# Patient Record
Sex: Male | Born: 2005 | Race: Black or African American | Hispanic: No | Marital: Single
Health system: Southern US, Community
[De-identification: ages and names within clinical notes are randomized; demographics above are authoritative.]

---
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
## 2024-02-02 ENCOUNTER — Emergency Department (HOSPITAL_COMMUNITY)
Admission: EM | Admit: 2024-02-02 | Discharge: 2024-02-02 | Discharge status: Against Medical Advice | Attending: Emergency Medicine | Admitting: Emergency Medicine

## 2024-02-02 ENCOUNTER — Other Ambulatory Visit: Payer: Self-pay

## 2024-02-02 ENCOUNTER — Encounter (HOSPITAL_COMMUNITY): Payer: Self-pay

## 2024-02-02 DIAGNOSIS — Z5321 Procedure and treatment not carried out due to patient leaving prior to being seen by health care provider: Secondary | ICD-10-CM | POA: Diagnosis not present

## 2024-02-02 DIAGNOSIS — L02214 Cutaneous abscess of groin: Secondary | ICD-10-CM | POA: Insufficient documentation

## 2024-02-02 NOTE — ED Triage Notes (Signed)
 Pt arrives via POV. Pt reports he has a possible abscess to his sacral area. States he was at [facility] today and they told him he may have cellulitis. Denies fevers or any other symptoms. Pt is AxOx4.

## 2024-02-03 ENCOUNTER — Emergency Department (HOSPITAL_BASED_OUTPATIENT_CLINIC_OR_DEPARTMENT_OTHER)
Admission: EM | Admit: 2024-02-03 | Discharge: 2024-02-03 | Disposition: A | Discharge status: Home / Self Care | Attending: Emergency Medicine | Admitting: Emergency Medicine

## 2024-02-03 ENCOUNTER — Encounter (HOSPITAL_BASED_OUTPATIENT_CLINIC_OR_DEPARTMENT_OTHER): Payer: Self-pay | Admitting: Emergency Medicine

## 2024-02-03 DIAGNOSIS — L0231 Cutaneous abscess of buttock: Secondary | ICD-10-CM | POA: Insufficient documentation

## 2024-02-03 MED ORDER — DOXYCYCLINE HYCLATE 100 MG PO CAPS: 100.0000 mg | ORAL_CAPSULE | Freq: Two times a day (BID) | ORAL | 0 refills | Status: AC

## 2024-02-03 MED ORDER — DOXYCYCLINE HYCLATE 100 MG PO CAPS: 100.0000 mg | ORAL_CAPSULE | Freq: Two times a day (BID) | ORAL | 0 refills | Status: DC

## 2024-02-03 MED ORDER — LIDOCAINE-EPINEPHRINE-TETRACAINE (LET) TOPICAL GEL
3.0000 mL | Freq: Once | TOPICAL | Status: AC
  Administered 2024-02-03: 3 mL via TOPICAL
  Filled 2024-02-03: qty 3

## 2024-02-03 NOTE — ED Triage Notes (Signed)
 C/o possible abscess on lower back and sacral area. Seen at [facility] and [facility] yesterday. Was told by [facility] that he had cellulitis. Denies fever.

## 2024-02-03 NOTE — ED Provider Notes (Signed)
 [name] Provider Note   CSN: [id number] Arrival date & time: 02/03/24  1257     Patient presents with: Abscess   Marcus Spencer is a 18 y.o. male.  Without significant past medical history reports to emergency room with complaint of gluteal abscess.  Patient reports for the past 3 to 4 days he has had some pain right above his tailbone.  Had some surrounding erythema as well.  Denies fever or spontaneous drainage of the area.  He reports he can feel small area that he thinks is an abscess.  Has been on clindamycin for 1 day as prescribed by urgent care.    Abscess      Prior to Admission medications   Medication Sig Start Date End Date Taking? Authorizing Provider  amoxicillin  (AMOXIL ) 500 MG capsule Take 1 capsule (500 mg total) by mouth 3 (three) times daily. 12/08/22   Molpus, John, MD  HYDROcodone -acetaminophen  (NORCO) 5-325 MG tablet Take 1 tablet by mouth every 6 (six) hours as needed for severe pain. 12/08/22   Molpus, John, MD  naproxen  (NAPROSYN ) 500 MG tablet Take 1 tablet twice daily as needed for dental pain. 12/08/22   Molpus, Norleen, MD    Allergies: Patient has no known allergies.    Review of Systems  Skin:  Positive for wound.    Updated Vital Signs BP 126/74   Pulse 67   Temp 98.5 F (36.9 C) (Oral)   Resp 18   SpO2 99%   Physical Exam Vitals and nursing note reviewed.  Constitutional:      General: He is not in acute distress.    Appearance: He is not toxic-appearing.  HENT:     Head: Normocephalic and atraumatic.   Eyes:     General: No scleral icterus.    Conjunctiva/sclera: Conjunctivae normal.    Cardiovascular:     Rate and Rhythm: Normal rate and regular rhythm.     Pulses: Normal pulses.     Heart sounds: Normal heart sounds.  Pulmonary:     Effort: Pulmonary effort is normal. No respiratory distress.     Breath sounds: Normal breath sounds.  Abdominal:     General: Abdomen is flat.  Bowel sounds are normal.     Palpations: Abdomen is soft.     Tenderness: There is no abdominal tenderness.  Genitourinary:    Comments: Very small palpable abscess without any obvious area of fluctuance in left gluteal fold.  No significant surrounding cellulitis at this time, reports improved since yesterday.   Musculoskeletal:     Right lower leg: No edema.     Left lower leg: No edema.   Skin:    General: Skin is warm and dry.     Findings: No lesion.   Neurological:     General: No focal deficit present.     Mental Status: He is alert and oriented to person, place, and time. Mental status is at baseline.     (all labs ordered are listed, but only abnormal results are displayed) Labs Reviewed - No data to display  EKG: None  Radiology: No results found.   Procedures   Medications Ordered in the ED  lidocaine -EPINEPHrine -tetracaine  (LET) topical gel (has no administration in time range)                                    Medical Decision Making Risk  Prescription drug management.   This patient presents to the ED for concern of abscess, this involves an extensive number of treatment options, and is a complaint that carries with it a high risk of complications and morbidity.  The differential diagnosis includes perirectal abscess, pilonidal cyst, cellulitis, hemorrhoid, fistula    Problem List / ED Course / Critical interventions / Medication management  Patient has small abscess to left gluteal fold.  He has no surrounding cellulitis.  Exam is consistent with small abscess.  There is no palpable area of fluctuance thus looked at area with point-of-care ultrasound showing approximately 0.5 cm area of abscess.  We discussed continuing antibiotic treatment versus trial of aspiration versus incision and drainage.  Patient ultimately does not want to have incision and drainage and wants to continue trial of antibiotic.  Will send doxycycline .  He will return if symptoms  have not improved or worsen in 2 to 3 days.  He has no fever.  Hemodynamically stable.  Well-appearing I ordered medication including LET Reevaluation of the patient after these medicines showed that the patient improved I have reviewed the patients home medicines and have made adjustments as needed   Plan F/u w/ PCP in 2-3d to ensure resolution of sx.  Patient was given return precautions. Patient stable for discharge at this time.  Patient educated on sx/dx and verbalized understanding of plan. Return to ER w/ new or worsening sx.       Final diagnoses:  Gluteal abscess    ED Discharge Orders          Ordered    doxycycline  (VIBRAMYCIN ) 100 MG capsule  2 times daily        02/03/24 1626               Marcus Spencer, Warren SAILOR, PA-C 02/03/24 1627    Mannie Pac T, DO 02/05/24 [phone]

## 2024-02-03 NOTE — Discharge Instructions (Addendum)
 Return in 2 to 3 days for wound check if no improvement in symptoms.  1000 mg of Tylenol  every 8 hours.  You can also take ibuprofen every 8 hours.  He can use ice or warm compress over area.

## 2024-02-03 NOTE — ED Notes (Signed)
 DC paperwork given and verbally understood.

## 2024-02-08 ENCOUNTER — Emergency Department (HOSPITAL_BASED_OUTPATIENT_CLINIC_OR_DEPARTMENT_OTHER): Admission: EM | Admit: 2024-02-08 | Discharge: 2024-02-08 | Disposition: A | Discharge status: Home / Self Care

## 2024-02-08 ENCOUNTER — Other Ambulatory Visit: Payer: Self-pay

## 2024-02-08 DIAGNOSIS — Z4800 Encounter for change or removal of nonsurgical wound dressing: Secondary | ICD-10-CM | POA: Insufficient documentation

## 2024-02-08 DIAGNOSIS — L0231 Cutaneous abscess of buttock: Secondary | ICD-10-CM | POA: Insufficient documentation

## 2024-02-08 DIAGNOSIS — Z5189 Encounter for other specified aftercare: Secondary | ICD-10-CM

## 2024-02-08 NOTE — Discharge Instructions (Addendum)
 It was a pleasure taking care of you today.  Based on your history and physical exam I feel you are safe for discharge.  Overall your abscess looks much improved, no sign of redness, no tenderness, no areas of fluctuance.  You also do not have systemic symptoms, no fever and no chills.  Please continue the course of antibiotics that were prescribed to you previously and complete the entire course.  Please return to the emergency department or seek further medical care if you experiencing the following symptoms including but not limited to fever, chills, severe pain, drainage from infection site, swelling.  Commend follow-up with primary care and specialist as scheduled.

## 2024-02-08 NOTE — ED Provider Notes (Signed)
 [place] EMERGENCY DEPARTMENT AT [facility] Provider Note   CSN: [id number] Arrival date & time: 02/08/24  2035     Patient presents with: Wound Check   Marcus Spencer is a 18 y.o. male who presents emergency department for wound check.  Patient was recently seen on 02/03/2024 for gluteal abscess.  Shared decision making was completed at this time and patient opted for antibiotic treatment without incision and drainage.  Patient was placed on doxycycline  for 7 days.  On presentation the emergency department today patient denies systemic symptoms, no fever, no chills.  Patient denies pain and is able to sit down.  Patient states that previously the abscess area was super tender to touch and he was unable to sit.  Patient states that this is much improved.  Patient has no significant past medical history.    Wound Check       Prior to Admission medications   Medication Sig Start Date End Date Taking? Authorizing Provider  amoxicillin  (AMOXIL ) 500 MG capsule Take 1 capsule (500 mg total) by mouth 3 (three) times daily. 12/08/22   Molpus, John, MD  doxycycline  (VIBRAMYCIN ) 100 MG capsule Take 1 capsule (100 mg total) by mouth 2 (two) times daily for 7 days. 02/03/24 02/10/24  Barrett, Jamie N, PA-C  HYDROcodone -acetaminophen  (NORCO) 5-325 MG tablet Take 1 tablet by mouth every 6 (six) hours as needed for severe pain. 12/08/22   Molpus, John, MD  naproxen  (NAPROSYN ) 500 MG tablet Take 1 tablet twice daily as needed for dental pain. 12/08/22   Molpus, Norleen, MD    Allergies: Patient has no known allergies.    Review of Systems  Skin:  Positive for wound.    Updated Vital Signs BP 130/74 (BP Location: Left Arm)   Pulse 70   Temp 98.4 F (36.9 C) (Oral)   Resp 15   Ht 5' 9 (1.753 m)   Wt 68 kg   SpO2 99%   BMI 22.15 kg/m   Physical Exam Vitals and nursing note reviewed. Exam conducted with a chaperone present.  Constitutional:      General: He is awake. He is not in  acute distress.    Appearance: Normal appearance. He is not ill-appearing, toxic-appearing or diaphoretic.  HENT:     Head: Normocephalic and atraumatic.  Pulmonary:     Effort: Pulmonary effort is normal.  Genitourinary:    Comments: Left upper glute:Small firm area present where abscess was previously located. No tenderness to palpation, no erythema, drainage, or warmth appreciated. No fluctuance appreciated.  Skin:    Capillary Refill: Capillary refill takes less than 2 seconds.  Neurological:     General: No focal deficit present.     Mental Status: He is alert and oriented to person, place, and time.     Gait: Gait normal.  Psychiatric:        Mood and Affect: Mood normal.        Behavior: Behavior normal. Behavior is cooperative.    (all labs ordered are listed, but only abnormal results are displayed) Labs Reviewed - No data to display  EKG: None  Radiology: No results found.   Procedures   Medications Ordered in the ED - No data to display                                  Medical Decision Making  Patient presents to the ED for concern of  wound recheck, gluteal abscess, this involves an extensive number of treatment options, and is a complaint that carries with it a high risk of complications and morbidity.  The differential diagnosis includes worsening abscess, sepsis, ingrown hair, pilonidal cyst, etc.   Co morbidities that complicate the patient evaluation  None   Additional history obtained:  Additional history obtained from emergency department visit on 02/03/2024 for antibiotics were prescribed.    Medications:  I have reviewed the patients home medicines and have made adjustments as needed   Test Considered:  Imaging: Declined at this time as abscess is much improved, no areas of fluctuance, no erythema, no drainage, no warmth appreciated, nontender to palpation, no indication for incision and drainage   Critical  Interventions:  none   Problem List / ED Course:  18 year old male, gluteal abscess, previously seen on 02/03/2024, originally taking clindamycin prescribed by urgent care switched to doxycycline  Patient has been compliant with outpatient antibiotic therapy, abscess is much improved, patient denies fever, chills, no pain, no fluctuance On physical exam abscess much improved, no erythema, no fluctuance, no pain with palpation, no indication for incision and drainage or imaging at this time Patient instructed to continue course of doxycycline  and complete entire course Return precautions given Patient discharged   Reevaluation:  After the interventions noted above, I reevaluated the patient and found that they have :improved   Social Determinants of Health:  none   Dispostion:  After consideration of the diagnostic results and the patients response to treatment, I feel that the patent would benefit from charge and continuance of outpatient antibiotics as prescribed.  Recommend follow-up with primary care and specialist as scheduled.     Final diagnoses:  Gluteal abscess  Wound check, abscess    ED Discharge Orders     None          Janetta Terrall FALCON, PA-C 02/08/24 2138    Neysa Caron PARAS, DO 02/08/24 2145

## 2024-02-08 NOTE — ED Triage Notes (Signed)
 Pt POV for wound check, gluteal abscess drained, 6/26, no increased redness or drainage per pt.
